# Patient Record
Sex: Female | Born: 1985 | Race: Asian | Hispanic: No | Smoking: Never smoker
Health system: Southern US, Community
[De-identification: ages and names within clinical notes are randomized; demographics above are authoritative.]

---

## 2017-12-30 ENCOUNTER — Other Ambulatory Visit: Payer: Self-pay | Admitting: Internal Medicine

## 2017-12-30 DIAGNOSIS — N6320 Unspecified lump in the left breast, unspecified quadrant: Secondary | ICD-10-CM

## 2018-01-18 ENCOUNTER — Other Ambulatory Visit: Payer: Self-pay

## 2020-03-22 ENCOUNTER — Other Ambulatory Visit (HOSPITAL_COMMUNITY)
Admission: RE | Admit: 2020-03-22 | Discharge: 2020-03-22 | Disposition: A | Discharge status: Home / Self Care | Payer: 59 | Source: Ambulatory Visit | Attending: Physician Assistant | Admitting: Physician Assistant

## 2020-03-22 ENCOUNTER — Encounter: Payer: Self-pay | Admitting: Physician Assistant

## 2020-03-22 ENCOUNTER — Other Ambulatory Visit: Payer: Self-pay

## 2020-03-22 ENCOUNTER — Ambulatory Visit (INDEPENDENT_AMBULATORY_CARE_PROVIDER_SITE_OTHER): Payer: 59 | Admitting: Physician Assistant

## 2020-03-22 ENCOUNTER — Other Ambulatory Visit: Payer: Self-pay | Admitting: Physician Assistant

## 2020-03-22 VITALS — BP 104/66 | HR 68 | Temp 98.1°F | Ht 63.0 in | Wt 140.2 lb

## 2020-03-22 DIAGNOSIS — N6321 Unspecified lump in the left breast, upper outer quadrant: Secondary | ICD-10-CM | POA: Diagnosis not present

## 2020-03-22 DIAGNOSIS — Z01419 Encounter for gynecological examination (general) (routine) without abnormal findings: Secondary | ICD-10-CM

## 2020-03-22 DIAGNOSIS — Z13220 Encounter for screening for lipoid disorders: Secondary | ICD-10-CM

## 2020-03-22 DIAGNOSIS — B351 Tinea unguium: Secondary | ICD-10-CM | POA: Diagnosis not present

## 2020-03-22 LAB — COMPREHENSIVE METABOLIC PANEL
ALT: 14 U/L (ref 0–35)
AST: 13 U/L (ref 0–37)
Albumin: 4.2 g/dL (ref 3.5–5.2)
Alkaline Phosphatase: 37 U/L — ABNORMAL LOW (ref 39–117)
BUN: 15 mg/dL (ref 6–23)
CO2: 28 mEq/L (ref 19–32)
Calcium: 8.8 mg/dL (ref 8.4–10.5)
Chloride: 105 mEq/L (ref 96–112)
Creatinine, Ser: 0.6 mg/dL (ref 0.40–1.20)
GFR: 116.34 mL/min (ref >60.00)
Glucose, Bld: 91 mg/dL (ref 70–99)
Potassium: 4 mEq/L (ref 3.5–5.1)
Sodium: 136 mEq/L (ref 135–145)
Total Bilirubin: 0.4 mg/dL (ref 0.2–1.2)
Total Protein: 6.9 g/dL (ref 6.0–8.3)

## 2020-03-22 LAB — CBC WITH DIFFERENTIAL/PLATELET
Basophils Absolute: 0 10*3/uL (ref 0.0–0.1)
Basophils Relative: 0.9 % (ref 0.0–3.0)
Eosinophils Absolute: 0.1 10*3/uL (ref 0.0–0.7)
Eosinophils Relative: 2.7 % (ref 0.0–5.0)
HCT: 30.8 % — ABNORMAL LOW (ref 36.0–46.0)
Hemoglobin: 10.1 g/dL — ABNORMAL LOW (ref 12.0–15.0)
Lymphocytes Relative: 33.7 % (ref 12.0–46.0)
Lymphs Abs: 1.6 10*3/uL (ref 0.7–4.0)
MCHC: 32.9 g/dL (ref 30.0–36.0)
MCV: 73.3 fl — ABNORMAL LOW (ref 78.0–100.0)
Monocytes Absolute: 0.3 10*3/uL (ref 0.1–1.0)
Monocytes Relative: 6.3 % (ref 3.0–12.0)
Neutro Abs: 2.7 10*3/uL (ref 1.4–7.7)
Neutrophils Relative %: 56.4 % (ref 43.0–77.0)
Platelets: 416 10*3/uL — ABNORMAL HIGH (ref 150.0–400.0)
RBC: 4.2 Mil/uL (ref 3.87–5.11)
RDW: 16 % — ABNORMAL HIGH (ref 11.5–15.5)
WBC: 4.8 10*3/uL (ref 4.0–10.5)

## 2020-03-22 LAB — LIPID PANEL
Cholesterol: 137 mg/dL (ref 0–200)
HDL: 61.9 mg/dL (ref >39.00)
LDL Cholesterol: 67 mg/dL (ref 0–99)
NonHDL: 74.64
Total CHOL/HDL Ratio: 2
Triglycerides: 40 mg/dL (ref 0.0–149.0)
VLDL: 8 mg/dL (ref 0.0–40.0)

## 2020-03-22 MED ORDER — CICLOPIROX 8 % EX SOLN: Freq: Every day | CUTANEOUS | 0 refills | Status: DC

## 2020-03-22 NOTE — Patient Instructions (Addendum)
Great to meet you today! Keep up the good work with your health. Labs and pap will be resulted in your MyChart. Someone will call to schedule the [place] of the breast.  See you back in one year for a physical or sooner if needed.     Preventive Care 36-36 Years Old, Female Preventive care refers to lifestyle choices and visits with your health care provider that can promote health and wellness. This includes:  A yearly physical exam. This is also called an annual wellness visit.  Regular dental and eye exams.  Immunizations.  Screening for certain conditions.  Healthy lifestyle choices, such as: ? Eating a healthy diet. ? Getting regular exercise. ? Not using drugs or products that contain nicotine and tobacco. ? Limiting alcohol use. What can I expect for my preventive care visit? Physical exam Your health care provider may check your:  Height and weight. These may be used to calculate your BMI (body mass index). BMI is a measurement that tells if you are at a healthy weight.  Heart rate and blood pressure.  Body temperature.  Skin for abnormal spots. Counseling Your health care provider may ask you questions about your:  Past medical problems.  Family's medical history.  Alcohol, tobacco, and drug use.  Emotional well-being.  Home life and relationship well-being.  Sexual activity.  Diet, exercise, and sleep habits.  Work and work Statistician.  Access to firearms.  Method of birth control.  Menstrual cycle.  Pregnancy history. What immunizations do I need? Vaccines are usually given at various ages, according to a schedule. Your health care provider will recommend vaccines for you based on your age, medical history, and lifestyle or other factors, such as travel or where you work.   What tests do I need? Blood tests  Lipid and cholesterol levels. These may be checked every 5 years starting at age 45.  Hepatitis C test.  Hepatitis B  test. Screening  Diabetes screening. This is done by checking your blood sugar (glucose) after you have not eaten for a while (fasting).  STD (sexually transmitted disease) testing, if you are at risk.  BRCA-related cancer screening. This may be done if you have a family history of breast, ovarian, tubal, or peritoneal cancers.  Pelvic exam and Pap test. This may be done every 3 years starting at age 11. Starting at age 54, this may be done every 5 years if you have a Pap test in combination with an HPV test. Talk with your health care provider about your test results, treatment options, and if necessary, the need for more tests.   Follow these instructions at home: Eating and drinking  Eat a healthy diet that includes fresh fruits and vegetables, whole grains, lean protein, and low-fat dairy products.  Take vitamin and mineral supplements as recommended by your health care provider.  Do not drink alcohol if: ? Your health care provider tells you not to drink. ? You are pregnant, may be pregnant, or are planning to become pregnant.  If you drink alcohol: ? Limit how much you have to 0-1 drink a day. ? Be aware of how much alcohol is in your drink. In the [place] one drink equals one 12 oz bottle of beer (355 mL), one 5 oz glass of wine (148 mL), or one 1 oz glass of hard liquor (44 mL).   Lifestyle  Take daily care of your teeth and gums. Brush your teeth every morning and night with fluoride toothpaste. Floss  one time each day.  Stay active. Exercise for at least 30 minutes 5 or more days each week.  Do not use any products that contain nicotine or tobacco, such as cigarettes, e-cigarettes, and chewing tobacco. If you need help quitting, ask your health care provider.  Do not use drugs.  If you are sexually active, practice safe sex. Use a condom or other form of protection to prevent STIs (sexually transmitted infections).  If you do not wish to become pregnant, use a form of  birth control. If you plan to become pregnant, see your health care provider for a prepregnancy visit.  Find healthy ways to cope with stress, such as: ? Meditation, yoga, or listening to music. ? Journaling. ? Talking to a trusted person. ? Spending time with friends and family. Safety  Always wear your seat belt while driving or riding in a vehicle.  Do not drive: ? If you have been drinking alcohol. Do not ride with someone who has been drinking. ? When you are tired or distracted. ? While texting.  Wear a helmet and other protective equipment during sports activities.  If you have firearms in your house, make sure you follow all gun safety procedures.  Seek help if you have been physically or sexually abused. What's next?  Go to your health care provider once a year for an annual wellness visit.  Ask your health care provider how often you should have your eyes and teeth checked.  Stay up to date on all vaccines. This information is not intended to replace advice given to you by your health care provider. Make sure you discuss any questions you have with your health care provider. Document Revised: 09/18/2019 Document Reviewed: 10/01/2017 Elsevier Patient Education  2021 [employer].

## 2020-03-22 NOTE — Progress Notes (Signed)
New Patient Office Visit  Subjective:  Patient ID: Veronica Pham, female    DOB: 1985-08-22  Age: 35 y.o. MRN: [id number]  CC:  Chief Complaint  Patient presents with  . Annual Exam  . Hand Pain    Rt middle finger fungus     HPI Veronica Pham presents for new patient establishment and complete physical exam. Recently moved from [place] two years ago. Has two daughters, ages 110 and 4.5. Overall happy and pleased with her life. She does not have any major medical history and her family is overall in good health as well. It has been about 3 years since her last female exam.   She has had COVID-19 vaccines and will scan in her card when she gets home.   Social History   Socioeconomic History  . Marital status: Divorced    Spouse name: Not on file  . Number of children: Not on file  . Years of education: Not on file  . Highest education level: Not on file  Occupational History  . Not on file  Tobacco Use  . Smoking status: Never Smoker  . Smokeless tobacco: Never Used  Vaping Use  . Vaping Use: Never used  Substance and Sexual Activity  . Alcohol use: Never  . Drug use: Never  . Sexual activity: Yes  Other Topics Concern  . Not on file  Social History Narrative  . Not on file   Social Determinants of Health   Financial Resource Strain: Not on file  Food Insecurity: Not on file  Transportation Needs: Not on file  Physical Activity: Not on file  Stress: Not on file  Social Connections: Not on file  Intimate Partner Violence: Not on file    ROS Review of Systems  Constitutional: Negative for activity change, appetite change, fever and unexpected weight change.  HENT: Negative for congestion.   Eyes: Negative for visual disturbance.  Respiratory: Negative for apnea, cough and shortness of breath.   Cardiovascular: Negative for chest pain, palpitations and leg swelling.  Gastrointestinal: Negative for abdominal pain, blood in stool, constipation and diarrhea.   Endocrine: Negative for polydipsia, polyphagia and polyuria.  Genitourinary: Negative for dysuria and pelvic pain.  Musculoskeletal: Negative for arthralgias.  Skin: Negative for rash.  Neurological: Negative for dizziness, weakness and headaches.  Hematological: Negative for adenopathy. Does not bruise/bleed easily.  Psychiatric/Behavioral: Negative for sleep disturbance and suicidal ideas. The patient is not nervous/anxious.     Objective:   Today's Vitals: BP 104/66   Pulse 68   Temp 98.1 F (36.7 C)   Ht 5\' 3"  (1.6 m)   Wt 140 lb 3.2 oz (63.6 kg)   LMP 03/14/2020   SpO2 97%   BMI 24.84 kg/m   Physical Exam Vitals and nursing note reviewed. Exam conducted with a chaperone present.  Constitutional:      Appearance: Normal appearance. She is normal weight. She is not toxic-appearing.  HENT:     Head: Normocephalic and atraumatic.     Right Ear: Tympanic membrane, ear canal and external ear normal.     Left Ear: Tympanic membrane, ear canal and external ear normal.     Nose: Nose normal.     Mouth/Throat:     Mouth: Mucous membranes are moist.  Eyes:     Extraocular Movements: Extraocular movements intact.     Conjunctiva/sclera: Conjunctivae normal.     Pupils: Pupils are equal, round, and reactive to light.  Cardiovascular:  Rate and Rhythm: Normal rate and regular rhythm.     Pulses: Normal pulses.     Heart sounds: Normal heart sounds.  Pulmonary:     Effort: Pulmonary effort is normal.     Breath sounds: Normal breath sounds.  Chest:    Abdominal:     General: Abdomen is flat. Bowel sounds are normal.     Palpations: Abdomen is soft.  Genitourinary:    General: Normal vulva.     Labia:        Right: No tenderness.        Left: No tenderness.      Vagina: Normal.     Cervix: Normal.     Uterus: Normal.      Adnexa: Right adnexa normal and left adnexa normal.    Musculoskeletal:        General: Normal range of motion.     Cervical back: Normal  range of motion and neck supple.  Skin:    General: Skin is warm and dry.     Comments: R MIDDLE FINGER ONYCHOMYCOSIS PRESENT    Neurological:     General: No focal deficit present.     Mental Status: She is alert and oriented to person, place, and time.  Psychiatric:        Mood and Affect: Mood normal.        Behavior: Behavior normal.        Thought Content: Thought content normal.        Judgment: Judgment normal.     Assessment & Plan:   Problem List Items Addressed This Visit   None   Visit Diagnoses    Well woman exam with routine gynecological exam    -  Primary   Relevant Orders   Cytology - PAP   CBC with Differential/Platelet   Comprehensive metabolic panel   Lipid panel   Onychomycosis of nail of digit of hand       Relevant Medications   ciclopirox (CICLODAN) 8 % solution   Other Relevant Orders   CBC with Differential/Platelet   Comprehensive metabolic panel   Mass of upper outer quadrant of left breast       Relevant Orders   CBC with Differential/Platelet   Comprehensive metabolic panel   Lipid panel   [place] Unlisted Procedure Breast   Screening cholesterol level       Relevant Orders   Lipid panel      Outpatient Encounter Medications as of 03/22/2020  Medication Sig  . ciclopirox (CICLODAN) 8 % solution Apply topically at bedtime. Apply over nail and surrounding skin. Apply daily over previous coat. After seven (7) days, may remove with alcohol and continue cycle.   No facility-administered encounter medications on file as of 03/22/2020.    Follow-up: Return in about 1 year (around 03/22/2021) for CPE.    1. Well woman exam with routine gynecological exam Healthy female with pap smear done today. Will send results through [place] [facility]. Encouraged to keep up good work with her health.  2. Onychomycosis of nail of digit of hand Will try Ciclodan solution if we can get this medication approved. OTC treatments have not worked per patient and this has been  present about 7 months now.  3. Mass of upper outer quadrant of left breast Known mass, states she may have had mammogram or [place] (?) done about 3 years ago in [place]. She was told to continue monitoring. Will send order for [place] today.   4. Screening  cholesterol level Labs today, continue healthy diet and exercise.    Kleigh Hoelzer M Travante Knee, PA-C

## 2020-03-29 LAB — CYTOLOGY - PAP
Comment: NEGATIVE
High risk HPV: NEGATIVE

## 2020-04-05 ENCOUNTER — Other Ambulatory Visit: Payer: Self-pay

## 2020-04-05 ENCOUNTER — Ambulatory Visit
Admission: RE | Admit: 2020-04-05 | Discharge: 2020-04-05 | Disposition: A | Discharge status: Home / Self Care | Payer: 59 | Source: Ambulatory Visit | Attending: Physician Assistant | Admitting: Physician Assistant

## 2020-04-05 DIAGNOSIS — N6321 Unspecified lump in the left breast, upper outer quadrant: Secondary | ICD-10-CM

## 2020-04-16 ENCOUNTER — Ambulatory Visit: Payer: 59 | Admitting: Physician Assistant

## 2020-04-16 DIAGNOSIS — Z0289 Encounter for other administrative examinations: Secondary | ICD-10-CM

## 2020-05-29 ENCOUNTER — Other Ambulatory Visit: Payer: Self-pay

## 2020-05-29 ENCOUNTER — Encounter: Payer: Self-pay | Admitting: Family

## 2020-05-29 ENCOUNTER — Ambulatory Visit (INDEPENDENT_AMBULATORY_CARE_PROVIDER_SITE_OTHER): Payer: 59 | Admitting: Family

## 2020-05-29 VITALS — BP 104/66 | HR 60 | Temp 98.7°F | Ht 63.0 in | Wt 142.8 lb

## 2020-05-29 DIAGNOSIS — B373 Candidiasis of vulva and vagina: Secondary | ICD-10-CM | POA: Diagnosis not present

## 2020-05-29 DIAGNOSIS — B351 Tinea unguium: Secondary | ICD-10-CM

## 2020-05-29 DIAGNOSIS — B3731 Acute candidiasis of vulva and vagina: Secondary | ICD-10-CM

## 2020-05-29 MED ORDER — FLUCONAZOLE 150 MG PO TABS: 150.0000 mg | ORAL_TABLET | Freq: Once | ORAL | 1 refills | Status: AC

## 2020-05-29 NOTE — Patient Instructions (Signed)
Vaginal Yeast Infection, Adult  Vaginal yeast infection is a condition that causes vaginal discharge as well as soreness, swelling, and redness (inflammation) of the vagina. This is a common condition. Some women get this infection frequently. What are the causes? This condition is caused by a change in the normal balance of the yeast (candida) and bacteria that live in the vagina. This change causes an overgrowth of yeast, which causes the inflammation. What increases the risk? The condition is more likely to develop in women who:  Take antibiotic medicines.  Have diabetes.  Take birth control pills.  Are pregnant.  Douche often.  Have a weak body defense system (immune system).  Have been taking steroid medicines for a long time.  Frequently wear tight clothing. What are the signs or symptoms? Symptoms of this condition include:  White, thick, creamy vaginal discharge.  Swelling, itching, redness, and irritation of the vagina. The lips of the vagina (vulva) may be affected as well.  Pain or a burning feeling while urinating.  Pain during sex. How is this diagnosed? This condition is diagnosed based on:  Your medical history.  A physical exam.  A pelvic exam. Your health care provider will examine a sample of your vaginal discharge under a microscope. Your health care provider may send this sample for testing to confirm the diagnosis. How is this treated? This condition is treated with medicine. Medicines may be over-the-counter or prescription. You may be told to use one or more of the following:  Medicine that is taken by mouth (orally).  Medicine that is applied as a cream (topically).  Medicine that is inserted directly into the vagina (suppository). Follow these instructions at home: Lifestyle  Do not have sex until your health care provider approves. Tell your sex partner that you have a yeast infection. That person should go to his or her health care  provider and ask if they should also be treated.  Do not wear tight clothes, such as pantyhose or tight pants.  Wear breathable cotton underwear. General instructions  Take or apply over-the-counter and prescription medicines only as told by your health care provider.  Eat more yogurt. This may help to keep your yeast infection from returning.  Do not use tampons until your health care provider approves.  Try taking a sitz bath to help with discomfort. This is a warm water bath that is taken while you are sitting down. The water should only come up to your hips and should cover your buttocks. Do this 3-4 times per day or as told by your health care provider.  Do not douche.  If you have diabetes, keep your blood sugar levels under control.  Keep all follow-up visits as told by your health care provider. This is important.   Contact a health care provider if:  You have a fever.  Your symptoms go away and then return.  Your symptoms do not get better with treatment.  Your symptoms get worse.  You have new symptoms.  You develop blisters in or around your vagina.  You have blood coming from your vagina and it is not your menstrual period.  You develop pain in your abdomen. Summary  Vaginal yeast infection is a condition that causes discharge as well as soreness, swelling, and redness (inflammation) of the vagina.  This condition is treated with medicine. Medicines may be over-the-counter or prescription.  Take or apply over-the-counter and prescription medicines only as told by your health care provider.  Do not   douche. Do not have sex or use tampons until your health care provider approves.  Contact a health care provider if your symptoms do not get better with treatment or your symptoms go away and then return. This information is not intended to replace advice given to you by your health care provider. Make sure you discuss any questions you have with your health care  provider. Document Revised: 08/20/2018 Document Reviewed: 06/08/2017 Elsevier Patient Education  2021 [employer].   Fungal Nail Infection A fungal nail infection is a common infection of the toenails or fingernails. This condition affects toenails more often than fingernails. It often affects the great, or big, toes. More than one nail may be infected. The condition can be passed from person to person (is contagious). What are the causes? This condition is caused by a fungus. Several types of fungi can cause the infection. These fungi are common in moist and warm areas. If your hands or feet come into contact with the fungus, it may get into a crack in your fingernail or toenail and cause the infection. What increases the risk? The following factors may make you more likely to develop this condition:  Being female.  Being of older age.  Living with someone who has the fungus.  Walking barefoot in areas where the fungus thrives, such as showers or locker rooms.  Wearing shoes and socks that cause your feet to sweat.  Having a nail injury or a recent nail surgery.  Having certain medical conditions, such as: ? Athlete's foot. ? Diabetes. ? Psoriasis. ? Poor circulation. ? A weak body defense system (immune system). What are the signs or symptoms? Symptoms of this condition include:  A pale spot on the nail.  Thickening of the nail.  A nail that becomes yellow or brown.  A brittle or ragged nail edge.  A crumbling nail.  A nail that has lifted away from the nail bed.   How is this diagnosed? This condition is diagnosed with a physical exam. Your health care provider may take a scraping or clipping from your nail to test for the fungus. How is this treated? Treatment is not needed for mild infections. If you have significant nail changes, treatment may include:  Antifungal medicines taken by mouth (orally). You may need to take the medicine for several weeks or several  months, and you may not see the results for a long time. These medicines can cause side effects. Ask your health care provider what problems to watch for.  Antifungal nail polish or nail cream. These may be used along with oral antifungal medicines.  Laser treatment of the nail.  Surgery to remove the nail. This may be needed for the most severe infections. It can take a long time, usually up to a year, for the infection to go away. The infection may also come back.   Follow these instructions at home: Medicines  Take or apply over-the-counter and prescription medicines only as told by your health care provider.  Ask your health care provider about using over-the-counter mentholated ointment on your nails. Nail care  Trim your nails often.  Wash and dry your hands and feet every day.  Keep your feet dry: ? Wear absorbent socks, and change your socks frequently. ? Wear shoes that allow air to circulate, such as sandals or canvas tennis shoes. Throw out old shoes.  Do not use artificial nails.  If you go to a nail salon, make sure you choose one that  uses clean instruments.  Use antifungal foot powder on your feet and in your shoes. General instructions  Do not share personal items, such as towels or nail clippers.  Do not walk barefoot in shower rooms or locker rooms.  Wear rubber gloves if you are working with your hands in wet areas.  Keep all follow-up visits as told by your health care provider. This is important. Contact a health care provider if: Your infection is not getting better or it is getting worse after several months. Summary  A fungal nail infection is a common infection of the toenails or fingernails.  Treatment is not needed for mild infections. If you have significant nail changes, treatment may include taking medicine orally and applying medicine to your nails.  It can take a long time, usually up to a year, for the infection to go away. The infection  may also come back.  Take or apply over-the-counter and prescription medicines only as told by your health care provider.  Follow instructions for taking care of your nails to help prevent infection from coming back or spreading. This information is not intended to replace advice given to you by your health care provider. Make sure you discuss any questions you have with your health care provider. Document Revised: 05/13/2018 Document Reviewed: 06/26/2017 Elsevier Patient Education  2021 [employer].

## 2020-05-30 ENCOUNTER — Encounter: Payer: Self-pay | Admitting: Family

## 2020-05-30 NOTE — Progress Notes (Signed)
Acute Office Visit  Subjective:    Patient ID: Veronica Pham, female    DOB: 1985-07-16, 35 y.o.   MRN: [id number]  Chief Complaint  Patient presents with  . Nail Problem    Pt states that she is following up on fungus in her nail on the middle finger. She says that it is not clearing up since seeing Alyssa.     HPI Patient is in today with c/o persistent fungus on the 3rd finger on the left hand. Has been using Penlaq x 2 months and sees some improvement but is not sure it is working efficiently.   Patient also complains of a thick, clumpy, white discharge that occurs after her menstrual cycle. She has been using a OTC cream to help when it occurs. She is currently having discharge and requesting treatment.     Social History   Socioeconomic History  . Marital status: Divorced    Spouse name: Not on file  . Number of children: Not on file  . Years of education: Not on file  . Highest education level: Not on file  Occupational History  . Not on file  Tobacco Use  . Smoking status: Never Smoker  . Smokeless tobacco: Never Used  Vaping Use  . Vaping Use: Never used  Substance and Sexual Activity  . Alcohol use: Never  . Drug use: Never  . Sexual activity: Yes  Other Topics Concern  . Not on file  Social History Narrative  . Not on file   Social Determinants of Health   Financial Resource Strain: Not on file  Food Insecurity: Not on file  Transportation Needs: Not on file  Physical Activity: Not on file  Stress: Not on file  Social Connections: Not on file  Intimate Partner Violence: Not on file    Outpatient Medications Prior to Visit  Medication Sig Dispense Refill  . ciclopirox (CICLODAN) 8 % solution Apply topically at bedtime. Apply over nail and surrounding skin. Apply daily over previous coat. After seven (7) days, may remove with alcohol and continue cycle. 6.6 mL 0   No facility-administered medications prior to visit.    No Known Allergies  Review  of Systems  Constitutional: Negative.   Respiratory: Negative.   Cardiovascular: Negative.   Genitourinary: Positive for vaginal discharge.  Skin: Negative for rash and wound.       Nail fungus, left hand  Allergic/Immunologic: Negative.   Neurological: Negative.   Psychiatric/Behavioral: Negative.   All other systems reviewed and are negative.      Objective:    Physical Exam Vitals reviewed.  Constitutional:      Appearance: Normal appearance.  HENT:     Head: Normocephalic.     Right Ear: Tympanic membrane normal.     Left Ear: Tympanic membrane normal.  Cardiovascular:     Rate and Rhythm: Normal rate and regular rhythm.     Pulses: Normal pulses.     Heart sounds: Normal heart sounds.  Musculoskeletal:     Cervical back: Normal range of motion and neck supple.  Skin:    General: Skin is warm and dry.     Comments: Fungus noted to the left phalanx, 3rd digit. Appears to be healing at the proximal/cuticle side. Nailbed in tact. No redness  Neurological:     General: No focal deficit present.     Mental Status: She is alert and oriented to person, place, and time.     BP 104/66   Pulse  60   Temp 98.7 F (37.1 C) (Temporal)   Ht 5\' 3"  (1.6 m)   Wt 142 lb 12.8 oz (64.8 kg)   SpO2 98%   BMI 25.30 kg/m  Wt Readings from Last 3 Encounters:  05/29/20 142 lb 12.8 oz (64.8 kg)  03/22/20 140 lb 3.2 oz (63.6 kg)    Health Maintenance Due  Topic Date Due  . Hepatitis C Screening  Never done  . HIV Screening  Never done    There are no preventive care reminders to display for this patient.   No results found for: TSH Lab Results  Component Value Date   WBC 4.8 03/22/2020   HGB 10.1 (L) 03/22/2020   HCT 30.8 (L) 03/22/2020   MCV 73.3 (L) 03/22/2020   PLT 416.0 (H) 03/22/2020   Lab Results  Component Value Date   NA 136 03/22/2020   K 4.0 03/22/2020   CO2 28 03/22/2020   GLUCOSE 91 03/22/2020   BUN 15 03/22/2020   CREATININE 0.60 03/22/2020   BILITOT  0.4 03/22/2020   ALKPHOS 37 (L) 03/22/2020   AST 13 03/22/2020   ALT 14 03/22/2020   PROT 6.9 03/22/2020   ALBUMIN 4.2 03/22/2020   CALCIUM 8.8 03/22/2020   GFR 116.34 03/22/2020   Lab Results  Component Value Date   CHOL 137 03/22/2020   Lab Results  Component Value Date   HDL 61.90 03/22/2020   Lab Results  Component Value Date   LDLCALC 67 03/22/2020   Lab Results  Component Value Date   TRIG 40.0 03/22/2020   Lab Results  Component Value Date   CHOLHDL 2 03/22/2020   No results found for: HGBA1C     Assessment & Plan:   Problem List Items Addressed This Visit   None   Visit Diagnoses    Vaginal candida    -  Primary   Tinea of nail           Meds ordered this encounter  Medications  . fluconazole (DIFLUCAN) 150 MG tablet    Sig: Take 1 tablet (150 mg total) by mouth once for 1 dose.    Dispense:  1 tablet    Refill:  1   Continue using Penlaq. Advised that nail fungus can take 6-8 months to be completely cured. Patient verbalized understanding and will continue treatment  Kennyth Arnold, FNP

## 2021-03-11 ENCOUNTER — Other Ambulatory Visit: Payer: Self-pay | Admitting: Physician Assistant

## 2021-03-11 DIAGNOSIS — Z1231 Encounter for screening mammogram for malignant neoplasm of breast: Secondary | ICD-10-CM

## 2021-03-25 ENCOUNTER — Encounter: Payer: 59 | Admitting: Physician Assistant

## 2021-04-08 ENCOUNTER — Ambulatory Visit
Admission: RE | Admit: 2021-04-08 | Discharge: 2021-04-08 | Disposition: A | Discharge status: Home / Self Care | Payer: Managed Care, Other (non HMO) | Source: Ambulatory Visit | Attending: Physician Assistant | Admitting: Physician Assistant

## 2021-04-08 DIAGNOSIS — Z1231 Encounter for screening mammogram for malignant neoplasm of breast: Secondary | ICD-10-CM

## 2021-04-17 ENCOUNTER — Encounter: Payer: Managed Care, Other (non HMO) | Admitting: Physician Assistant

## 2021-04-23 ENCOUNTER — Encounter: Payer: Managed Care, Other (non HMO) | Admitting: Physician Assistant

## 2021-04-26 ENCOUNTER — Encounter: Payer: Self-pay | Admitting: Physician Assistant

## 2021-04-26 ENCOUNTER — Other Ambulatory Visit (HOSPITAL_COMMUNITY)
Admission: RE | Admit: 2021-04-26 | Discharge: 2021-04-26 | Disposition: A | Discharge status: Home / Self Care | Payer: Managed Care, Other (non HMO) | Source: Ambulatory Visit | Attending: Physician Assistant | Admitting: Physician Assistant

## 2021-04-26 ENCOUNTER — Ambulatory Visit (INDEPENDENT_AMBULATORY_CARE_PROVIDER_SITE_OTHER): Payer: Managed Care, Other (non HMO) | Admitting: Physician Assistant

## 2021-04-26 VITALS — BP 110/76 | HR 64 | Temp 98.2°F | Resp 16 | Ht 63.0 in | Wt 140.8 lb

## 2021-04-26 DIAGNOSIS — Z0000 Encounter for general adult medical examination without abnormal findings: Secondary | ICD-10-CM | POA: Insufficient documentation

## 2021-04-26 DIAGNOSIS — D508 Other iron deficiency anemias: Secondary | ICD-10-CM | POA: Diagnosis not present

## 2021-04-26 DIAGNOSIS — Z23 Encounter for immunization: Secondary | ICD-10-CM

## 2021-04-26 DIAGNOSIS — K648 Other hemorrhoids: Secondary | ICD-10-CM

## 2021-04-26 DIAGNOSIS — R87612 Low grade squamous intraepithelial lesion on cytologic smear of cervix (LGSIL): Secondary | ICD-10-CM

## 2021-04-26 DIAGNOSIS — D485 Neoplasm of uncertain behavior of skin: Secondary | ICD-10-CM

## 2021-04-26 LAB — CBC WITH DIFFERENTIAL/PLATELET
Basophils Absolute: 0 10*3/uL (ref 0.0–0.1)
Basophils Relative: 0.8 % (ref 0.0–3.0)
Eosinophils Absolute: 0.1 10*3/uL (ref 0.0–0.7)
Eosinophils Relative: 2.2 % (ref 0.0–5.0)
HCT: 37.2 % (ref 36.0–46.0)
Hemoglobin: 12.3 g/dL (ref 12.0–15.0)
Lymphocytes Relative: 31.1 % (ref 12.0–46.0)
Lymphs Abs: 1.9 10*3/uL (ref 0.7–4.0)
MCHC: 33 g/dL (ref 30.0–36.0)
MCV: 83.6 fl (ref 78.0–100.0)
Monocytes Absolute: 0.5 10*3/uL (ref 0.1–1.0)
Monocytes Relative: 7.6 % (ref 3.0–12.0)
Neutro Abs: 3.6 10*3/uL (ref 1.4–7.7)
Neutrophils Relative %: 58.3 % (ref 43.0–77.0)
Platelets: 301 10*3/uL (ref 150.0–400.0)
RBC: 4.45 Mil/uL (ref 3.87–5.11)
RDW: 18.7 % — ABNORMAL HIGH (ref 11.5–15.5)
WBC: 6.2 10*3/uL (ref 4.0–10.5)

## 2021-04-26 LAB — COMPREHENSIVE METABOLIC PANEL
ALT: 17 U/L (ref 0–35)
AST: 17 U/L (ref 0–37)
Albumin: 4.5 g/dL (ref 3.5–5.2)
Alkaline Phosphatase: 39 U/L (ref 39–117)
BUN: 12 mg/dL (ref 6–23)
CO2: 28 mEq/L (ref 19–32)
Calcium: 9 mg/dL (ref 8.4–10.5)
Chloride: 104 mEq/L (ref 96–112)
Creatinine, Ser: 0.62 mg/dL (ref 0.40–1.20)
GFR: 114.54 mL/min (ref >60.00)
Glucose, Bld: 91 mg/dL (ref 70–99)
Potassium: 4.1 mEq/L (ref 3.5–5.1)
Sodium: 137 mEq/L (ref 135–145)
Total Bilirubin: 0.4 mg/dL (ref 0.2–1.2)
Total Protein: 7.2 g/dL (ref 6.0–8.3)

## 2021-04-26 LAB — LIPID PANEL
Cholesterol: 143 mg/dL (ref 0–200)
HDL: 58.2 mg/dL (ref >39.00)
LDL Cholesterol: 70 mg/dL (ref 0–99)
NonHDL: 84.57
Total CHOL/HDL Ratio: 2
Triglycerides: 75 mg/dL (ref 0.0–149.0)
VLDL: 15 mg/dL (ref 0.0–40.0)

## 2021-04-26 MED ORDER — HYDROCORTISONE ACETATE 25 MG RE SUPP: RECTAL | 0 refills | Status: AC

## 2021-04-26 NOTE — Progress Notes (Signed)
? ?Subjective:  ? ? Patient ID: Veronica Pham, female    DOB: 07-02-1985, 36 y.o.   MRN: [id number] ? ?Chief Complaint  ?Patient presents with  ? Annual Exam  ? ? ?HPI ?Patient is in today for annual exam. ? ?Acute concerns: ?Chronic iron def anemia, recheck labs today. ?Also, new ?mole on left pinky toe came up in the last few months. ?Requesting cream for hemorrhoids - not currently flared up today.  ? ?Health maintenance: ?Lifestyle/ exercise: Tried to start exercising more, minimal right now  ?Nutrition: Trying to snack less; usually eats at home - mother living with her right now and cooking a lot  ?Mental health: No concerns  ?Sleep: Doing well  ?Substance use: None ?ETOH: None  ?Sexual activity: Married, monogamous  ?Immunizations: Tdap today  ?Pap: LGSIL last year, HPV negative; repeat today  ? ? ? ?History reviewed. No pertinent past medical history. ? ?History reviewed. No pertinent surgical history. ? ?Family History  ?Problem Relation Age of Onset  ? Breast cancer Neg Hx   ? Colon cancer Neg Hx   ? ? ?Social History  ? ?Tobacco Use  ? Smoking status: Never  ? Smokeless tobacco: Never  ?Vaping Use  ? Vaping Use: Never used  ?Substance Use Topics  ? Alcohol use: Never  ? Drug use: Never  ?  ? ?No Known Allergies ? ?Review of Systems ?NEGATIVE UNLESS OTHERWISE INDICATED IN HPI ? ? ?   ?Objective:  ?  ? ?BP 110/76   Pulse 64   Temp 98.2 ?F (36.8 ?C)   Resp 16   Ht '5\' 3"'$  (1.6 m)   Wt 140 lb 12.8 oz (63.9 kg)   SpO2 98%   BMI 24.94 kg/m?  ? ?Wt Readings from Last 3 Encounters:  ?04/26/21 140 lb 12.8 oz (63.9 kg)  ?05/29/20 142 lb 12.8 oz (64.8 kg)  ?03/22/20 140 lb 3.2 oz (63.6 kg)  ? ? ?BP Readings from Last 3 Encounters:  ?04/26/21 110/76  ?05/29/20 104/66  ?03/22/20 104/66  ?  ? ?Physical Exam ?Vitals and nursing note reviewed. Exam conducted with a chaperone present.  ?Constitutional:   ?   Appearance: Normal appearance. She is normal weight. She is not toxic-appearing.  ?HENT:  ?   Head: Normocephalic  and atraumatic.  ?   Right Ear: Tympanic membrane, ear canal and external ear normal.  ?   Left Ear: Tympanic membrane, ear canal and external ear normal.  ?   Nose: Nose normal.  ?   Mouth/Throat:  ?   Mouth: Mucous membranes are moist.  ?Eyes:  ?   Extraocular Movements: Extraocular movements intact.  ?   Conjunctiva/sclera: Conjunctivae normal.  ?   Pupils: Pupils are equal, round, and reactive to light.  ?Cardiovascular:  ?   Rate and Rhythm: Normal rate and regular rhythm.  ?   Pulses: Normal pulses.  ?   Heart sounds: Normal heart sounds.  ?Pulmonary:  ?   Effort: Pulmonary effort is normal.  ?   Breath sounds: Normal breath sounds.  ?Abdominal:  ?   General: Abdomen is flat. Bowel sounds are normal.  ?   Palpations: Abdomen is soft.  ?Genitourinary: ?   Labia:     ?   Right: No rash or tenderness.     ?   Left: No rash or tenderness.   ?   Vagina: Normal.  ?   Cervix: Normal.  ?   Uterus: Normal.   ?   Adnexa:  Right adnexa normal and left adnexa normal.  ?Musculoskeletal:     ?   General: Normal range of motion.  ?   Cervical back: Normal range of motion and neck supple.  ?Skin: ?   General: Skin is warm and dry.  ?   Comments: Left small toe there is a small flat darkened lesion   ?Neurological:  ?   General: No focal deficit present.  ?   Mental Status: She is alert and oriented to person, place, and time.  ?Psychiatric:     ?   Mood and Affect: Mood normal.     ?   Behavior: Behavior normal.     ?   Thought Content: Thought content normal.     ?   Judgment: Judgment normal.  ? ? ?   ?Assessment & Plan:  ? ?Problem List Items Addressed This Visit   ?None ?Visit Diagnoses   ? ? Encounter for annual physical exam    -  Primary  ? Relevant Orders  ? Tdap vaccine greater than or equal to 7yo IM (Completed)  ? CBC with Differential/Platelet (Completed)  ? Comprehensive metabolic panel (Completed)  ? Lipid panel (Completed)  ? Iron, TIBC and Ferritin Panel (Completed)  ? Cytology - PAP  ? Need for Tdap  vaccination      ? Relevant Orders  ? Tdap vaccine greater than or equal to 7yo IM (Completed)  ? Other iron deficiency anemia      ? Relevant Orders  ? CBC with Differential/Platelet (Completed)  ? Iron, TIBC and Ferritin Panel (Completed)  ? LGSIL on Pap smear of cervix      ? Relevant Orders  ? Cytology - PAP  ? Other hemorrhoids      ? Neoplasm of uncertain behavior of skin      ? Relevant Orders  ? Ambulatory referral to Dermatology  ? ?  ? ? ? ?Meds ordered this encounter  ?Medications  ? hydrocortisone (ANUSOL-HC) 25 MG suppository  ?  Sig: Place one suppository rectally as needed for hemorrhoid flare-up.  ?  Dispense:  12 suppository  ?  Refill:  0  ? ?PLAN: ?-Age-appropriate screening and counseling performed today. Will check labs and call with results. Preventive measures discussed and printed in AVS for patient.  ?-Pap today, treat pending results ?-Doing well with health goals, needs to inc exercise & limit snacks ?-Tdap today ?-Referral to derm ?-Anusol for hemorrhoids to use prn  ? ?F/up 1 year or prn  ? ? ?This note was prepared with assistance of Systems analyst. Occasional wrong-word or sound-a-like substitutions may have occurred due to the inherent limitations of voice recognition software. ? ? ? ?Jamille Yoshino M Marycatherine Maniscalco, PA-C ?

## 2021-04-26 NOTE — Patient Instructions (Signed)
Good to see you today! ?Please go to the lab for blood work and I will send results through [place]. ?Keep working towards health goals! ?Call if any concerns.  ?

## 2021-04-27 LAB — IRON,TIBC AND FERRITIN PANEL
%SAT: 12 % (calc) — ABNORMAL LOW (ref 16–45)
Ferritin: 7 ng/mL — ABNORMAL LOW (ref 16–154)
Iron: 45 ug/dL (ref 40–190)
TIBC: 380 mcg/dL (calc) (ref 250–450)

## 2021-05-01 LAB — CYTOLOGY - PAP
Comment: NEGATIVE
Diagnosis: NEGATIVE
Diagnosis: REACTIVE
High risk HPV: NEGATIVE

## 2021-09-24 ENCOUNTER — Ambulatory Visit (INDEPENDENT_AMBULATORY_CARE_PROVIDER_SITE_OTHER): Payer: Commercial Managed Care - HMO | Admitting: Family Medicine

## 2021-09-24 ENCOUNTER — Encounter: Payer: Self-pay | Admitting: Family Medicine

## 2021-09-24 VITALS — BP 104/72 | HR 64 | Temp 98.4°F | Ht 63.0 in | Wt 140.2 lb

## 2021-09-24 DIAGNOSIS — M542 Cervicalgia: Secondary | ICD-10-CM

## 2021-09-24 MED ORDER — METHOCARBAMOL 500 MG PO TABS: 500.0000 mg | ORAL_TABLET | Freq: Four times a day (QID) | ORAL | 0 refills | Status: AC | PRN

## 2021-09-24 NOTE — Patient Instructions (Addendum)
Ibuprofen '600mg'$  three times/day if needed  Massage.  Icey hot, etc.   Consdier physical therapy if not improving. Stretches.

## 2021-09-24 NOTE — Progress Notes (Signed)
   Subjective:     Patient ID: Veronica Pham, female    DOB: 1985/07/09, 36 y.o.   MRN: [id number]  Chief Complaint  Patient presents with   Shoulder Pain    Right sided shoulder, back and neck pain that started 2 weeks ago     HPI 2 wks R shoulder.  [facility]  No specific injury. In the am, R neck,shoulder, upper back tender. Hard to get up from bed, then better during day.  [facility] sleeps on side. Hurts to turn to R(head). Not rad down arm.  Using "cold patch".  No n/t/weakness.   Health Maintenance Due  Topic Date Due   COVID-19 Vaccine (1) Never done   HIV Screening  Never done   Hepatitis C Screening  Never done   INFLUENZA VACCINE  09/03/2021    History reviewed. No pertinent past medical history.  History reviewed. No pertinent surgical history.  Outpatient Medications Prior to Visit  Medication Sig Dispense Refill   hydrocortisone (ANUSOL-HC) 25 MG suppository Place one suppository rectally as needed for hemorrhoid flare-up. (Patient not taking: Reported on 09/24/2021) 12 suppository 0   ciclopirox (CICLODAN) 8 % solution Apply topically at bedtime. Apply over nail and surrounding skin. Apply daily over previous coat. After seven (7) days, may remove with alcohol and continue cycle. (Patient not taking: Reported on 04/26/2021) 6.6 mL 0   No facility-administered medications prior to visit.    No Known Allergies ROS neg/noncontributory except as noted HPI/below      Objective:     BP 104/72   Pulse 64   Temp 98.4 F (36.9 C) (Temporal)   Ht '5\' 3"'$  (1.6 m)   Wt 140 lb 4 oz (63.6 kg)   LMP 08/27/2021 (Approximate)   SpO2 98%   BMI 24.84 kg/m  Wt Readings from Last 3 Encounters:  09/24/21 140 lb 4 oz (63.6 kg)  04/26/21 140 lb 12.8 oz (63.9 kg)  05/29/20 142 lb 12.8 oz (64.8 kg)    Physical Exam   Gen: WDWN NAD HEENT: NCAT, conjunctiva not injected, sclera nonicteric NECK:  supple, no thyromegaly, no nodes.  No TTP spine.  Some tight muscles  on R.  Dec rotation to R.  R shoulder-FROM but some tenderness w/rotation.  Strength 5/5 BUE EXT:  no edema MSK: no gross abnormalities.  NEURO: A&O x3.  CN II-XII intact.  PSYCH: normal mood. Good eye contact     Assessment & Plan:   Problem List Items Addressed This Visit   None Visit Diagnoses     Cervicalgia    -  Primary      Cervicalgia-suspect more muscular.  Stretches, massage, ibuprofen, iceyhot, etc.  Methocarbamol '500mg'$  more for HS.    Consider PT if not improving.    Meds ordered this encounter  Medications   methocarbamol (ROBAXIN) 500 MG tablet    Sig: Take 1 tablet (500 mg total) by mouth every 6 (six) hours as needed for muscle spasms.    Dispense:  20 tablet    Refill:  0    Wellington Hampshire, MD

## 2021-11-25 ENCOUNTER — Encounter: Payer: Self-pay | Admitting: Family

## 2021-11-25 ENCOUNTER — Ambulatory Visit (INDEPENDENT_AMBULATORY_CARE_PROVIDER_SITE_OTHER): Payer: Commercial Managed Care - HMO | Admitting: Family

## 2021-11-25 VITALS — BP 101/66 | HR 54 | Temp 98.2°F | Ht 63.0 in | Wt 140.6 lb

## 2021-11-25 DIAGNOSIS — H44811 Hemophthalmos, right eye: Secondary | ICD-10-CM

## 2021-11-25 DIAGNOSIS — Z23 Encounter for immunization: Secondary | ICD-10-CM

## 2021-11-25 NOTE — Progress Notes (Signed)
   Patient ID: Veronica Pham, female    DOB: 09/17/1985, 36 y.o.   MRN: [id number]  Chief Complaint  Patient presents with   Eye Problem    Pt c/o right being red for 2-3 months off an on. Usually goes away in a week. Pt states it is not painful.     HPI:     Bleeding in eye:   reports happening every few months, she notices redness in her right eye, in the inside corner of white part of her eye, and it clears up, but has happened a few times just this year. Denies any pain, itching, scratchy feeling, no discharge. No recent travel. Denies any new exercise or recent sinus illness or cough. Uses OTC eye drops daily for dry eye, has been doing this for many years.      Assessment & Plan:  1. Eye hemorrhage, right noted in inside corner of eye, no discharge, no vision changes. Advised pt on possible causes and handout provided, see no reason for concern at this time, but do recommend she have an opthalmologist do a full assessment.  2. Need for influenza vaccination  - Flu Vaccine QUAD 6+ mos PF IM (Fluarix Quad PF)    Subjective:    Outpatient Medications Prior to Visit  Medication Sig Dispense Refill   hydrocortisone (ANUSOL-HC) 25 MG suppository Place one suppository rectally as needed for hemorrhoid flare-up. 12 suppository 0   methocarbamol (ROBAXIN) 500 MG tablet Take 1 tablet (500 mg total) by mouth every 6 (six) hours as needed for muscle spasms. 20 tablet 0   No facility-administered medications prior to visit.   No past medical history on file. No past surgical history on file. No Known Allergies    Objective:    Physical Exam Vitals and nursing note reviewed.  Constitutional:      Appearance: Normal appearance.  Eyes:     General: No allergic shiner.       Right eye: No discharge or hordeolum.     Extraocular Movements: Extraocular movements intact.     Conjunctiva/sclera:     Right eye: Hemorrhage (inside corner of sclera, light red in color, mild) present.   Cardiovascular:     Rate and Rhythm: Normal rate and regular rhythm.  Pulmonary:     Effort: Pulmonary effort is normal.     Breath sounds: Normal breath sounds.  Musculoskeletal:        General: Normal range of motion.  Skin:    General: Skin is warm and dry.  Neurological:     Mental Status: She is alert.  Psychiatric:        Mood and Affect: Mood normal.        Behavior: Behavior normal.    BP 101/66 (BP Location: Left Arm, Patient Position: Sitting, Cuff Size: Large)   Pulse (!) 54   Temp 98.2 F (36.8 C) (Temporal)   Ht '5\' 3"'$  (1.6 m)   Wt 140 lb 9.6 oz (63.8 kg)   LMP 11/19/2021 (Approximate)   SpO2 96%   BMI 24.91 kg/m  Wt Readings from Last 3 Encounters:  11/25/21 140 lb 9.6 oz (63.8 kg)  09/24/21 140 lb 4 oz (63.6 kg)  04/26/21 140 lb 12.8 oz (63.9 kg)       Jeanie Sewer, NP

## 2021-11-25 NOTE — Patient Instructions (Signed)
It was very nice to see you today!    I recommend you see an eye doctor (Optholmologist) to have your eyes checked and a vision screen.  See the handout attached for more information on bleeding in the eye.     PLEASE NOTE:  If you had any lab tests please let us know if you have not heard back within a few days. You may see your results on MyChart before we have a chance to review them but we will give you a call once they are reviewed by [place]. If we ordered any referrals today, please let us know if you have not heard from their office within the next week.

## 2021-11-26 DIAGNOSIS — Z23 Encounter for immunization: Secondary | ICD-10-CM

## 2021-12-20 ENCOUNTER — Ambulatory Visit: Payer: Commercial Managed Care - HMO | Admitting: Family Medicine

## 2022-03-30 IMAGING — MG MM DIGITAL SCREENING BILAT W/ TOMO AND CAD
8 series · 9 of 24 positions shown · non-contrast
Comparison: Previous exam(s).

CLINICAL DATA: Screening.

EXAM:
DIGITAL SCREENING BILATERAL MAMMOGRAM WITH TOMOSYNTHESIS AND CAD
TECHNIQUE: Bilateral screening digital craniocaudal and mediolateral oblique
mammograms were obtained. Bilateral screening digital breast
tomosynthesis was performed. The images were evaluated with
computer-aided detection.

[L CC synth-2D]
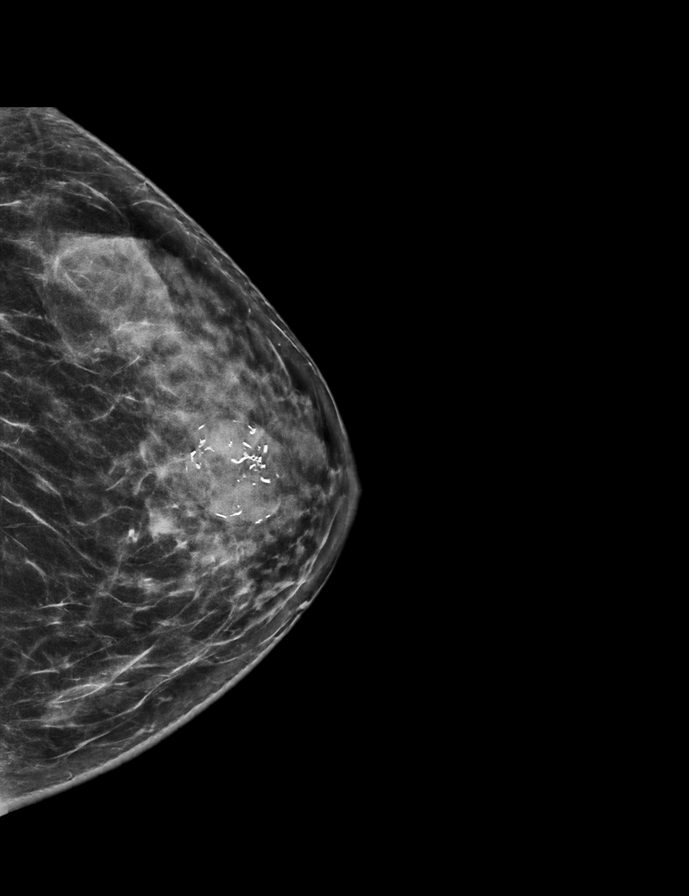

[R MLO synth-2D]
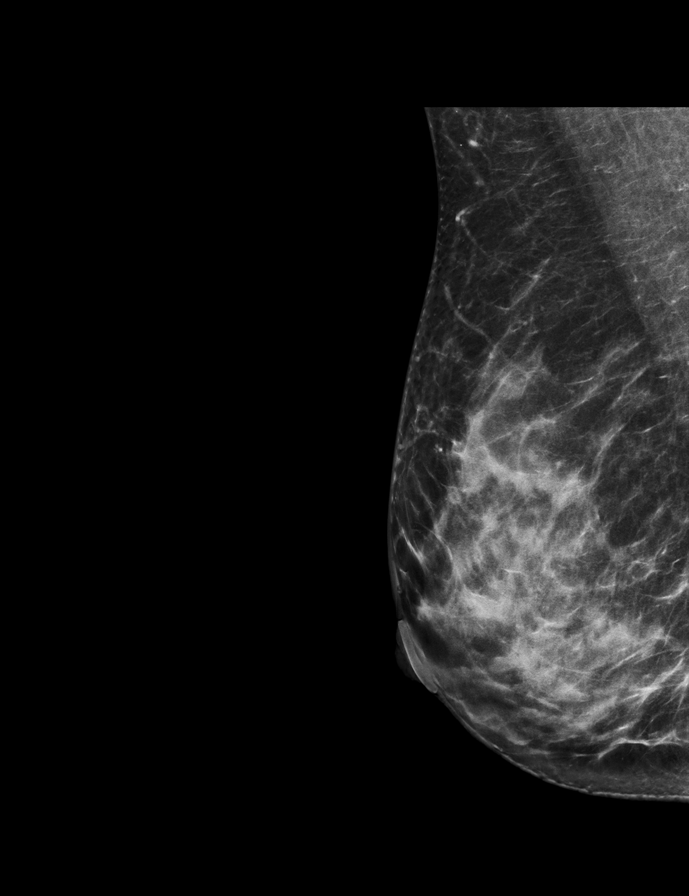

[R CC synth-2D]
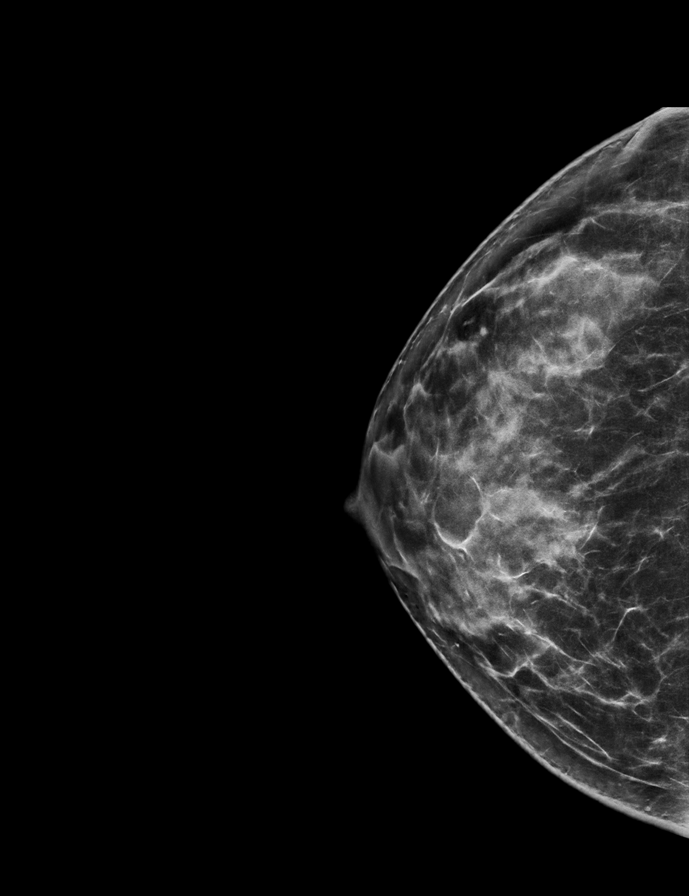

[L MLO synth-2D]
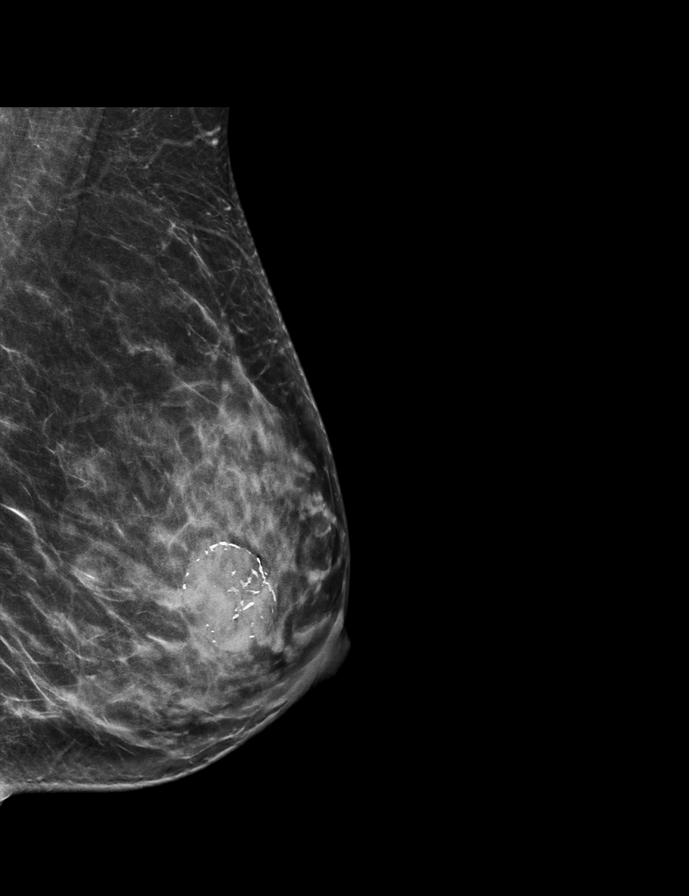

[R MLO tomo · 2 of 67 frames shown]
[frame 22/67]
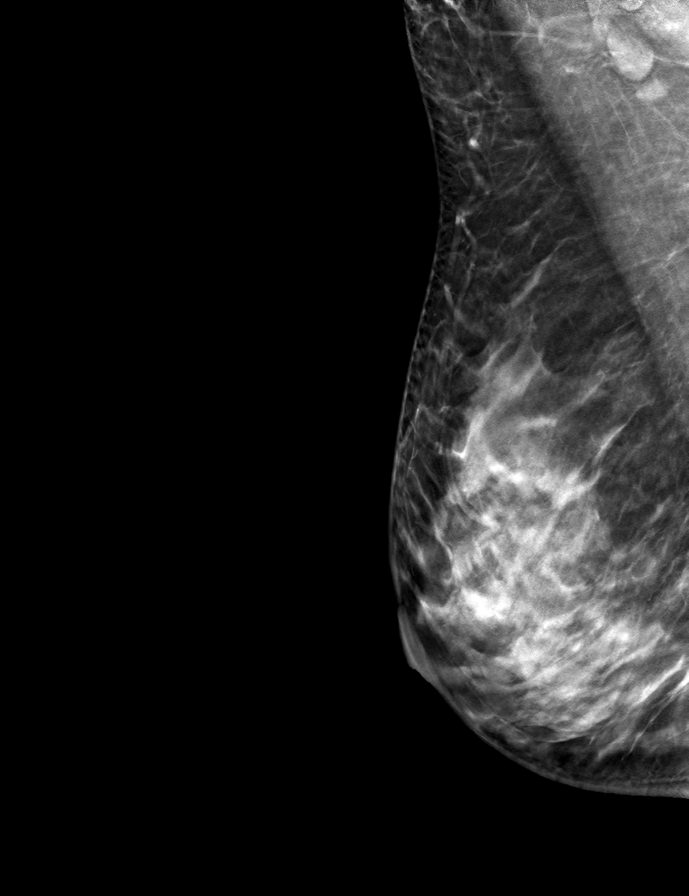
[frame 34/67]
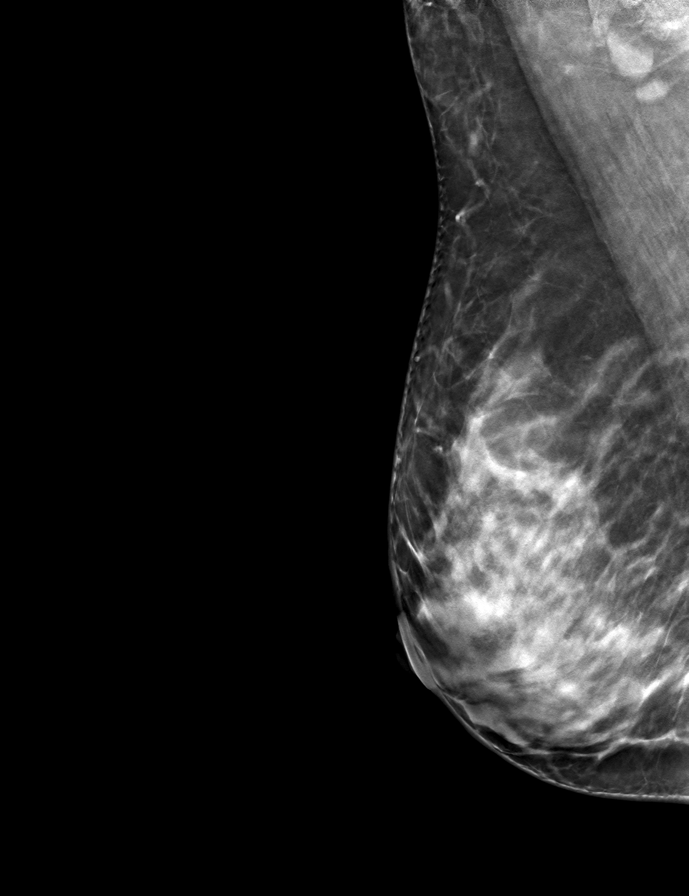

[L CC tomo · tomo slice 34/67.0]
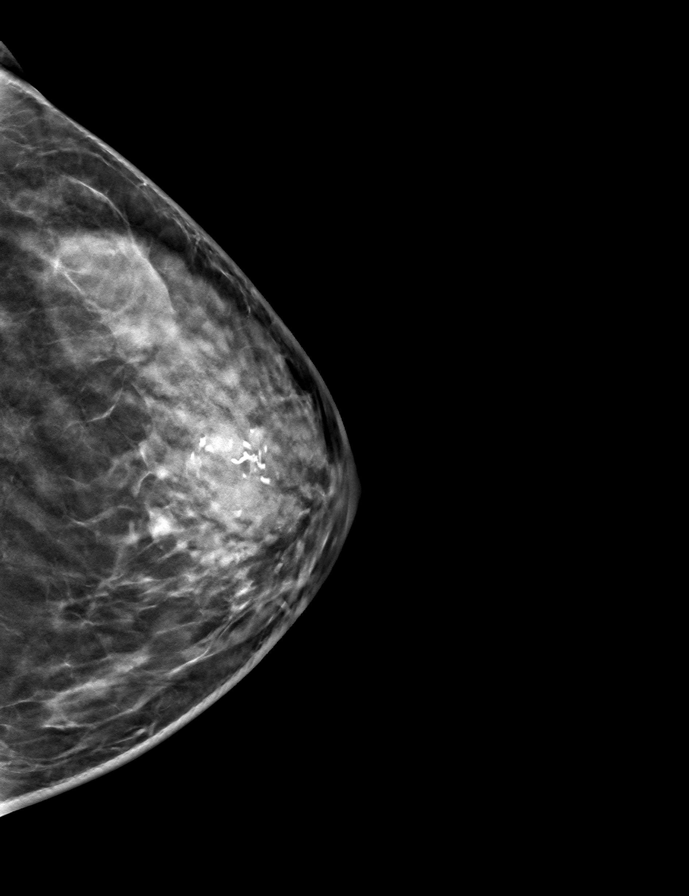

[L MLO tomo · tomo slice 33/66.0]
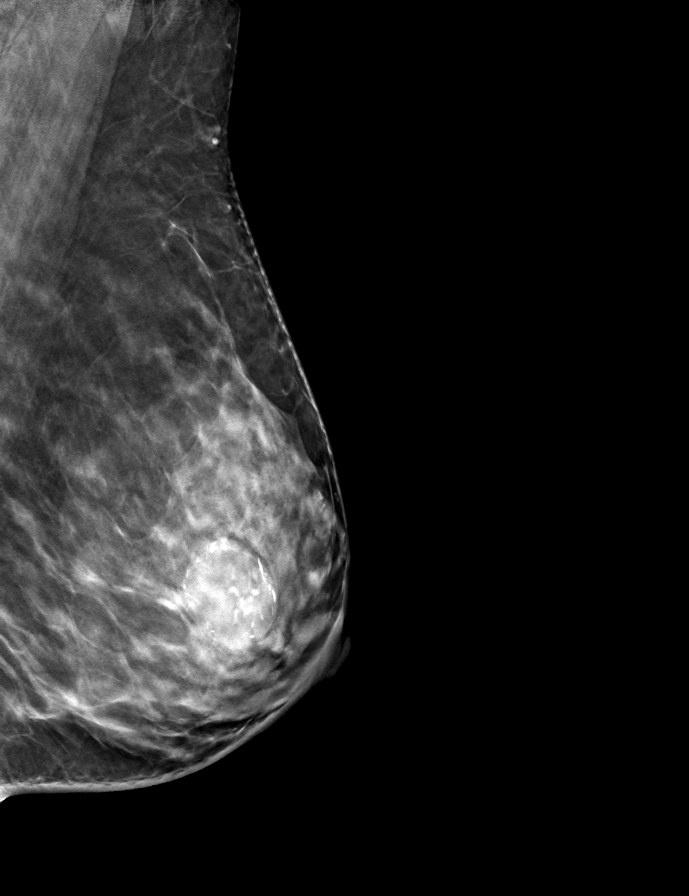

[R CC tomo · tomo slice 35/70.0]
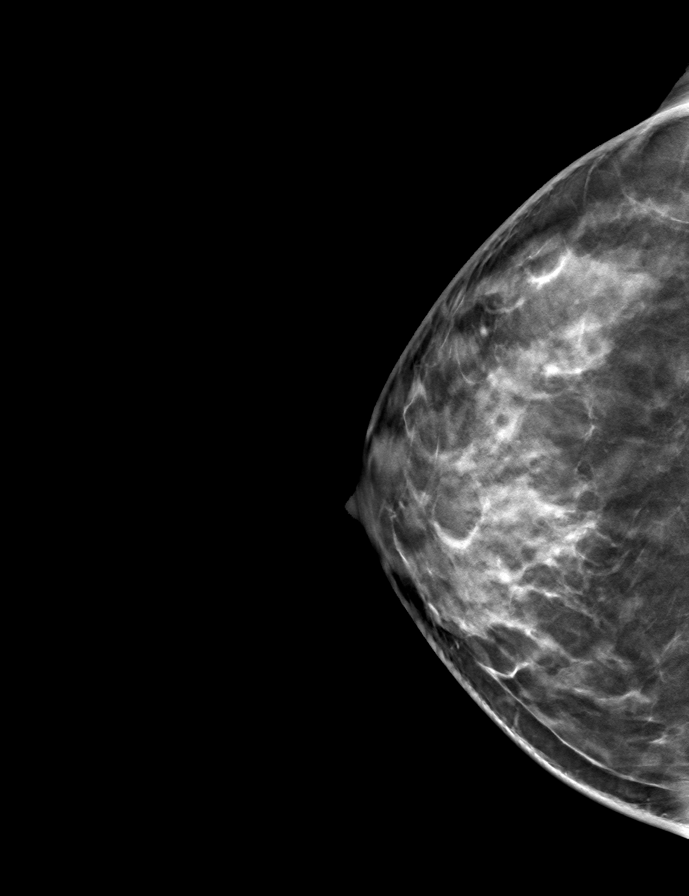

[9 of 24 positions shown; findings below may reference images not displayed]

ACR Breast Density Category c: The breast tissue is heterogeneously
dense, which may obscure small masses.
FINDINGS: There are no findings suspicious for malignancy.
IMPRESSION: No mammographic evidence of malignancy. A result letter of this
screening mammogram will be mailed directly to the patient.

RECOMMENDATION:
Screening mammogram at age 39. (Code:P4-T-W4U)

BI-RADS CATEGORY  1: Negative.
